# Patient Record
Sex: Female | Born: 1995 | Race: Asian | Hispanic: No | Marital: Single | Smoking: Current some day smoker
Health system: Southern US, Community
[De-identification: ages and names within clinical notes are randomized; demographics above are authoritative.]

## PROBLEM LIST (undated history)

## (undated) DIAGNOSIS — J45909 Unspecified asthma, uncomplicated: Secondary | ICD-10-CM

---

## 2019-10-22 ENCOUNTER — Emergency Department (HOSPITAL_COMMUNITY): Payer: Managed Care, Other (non HMO)

## 2019-10-22 ENCOUNTER — Emergency Department (HOSPITAL_COMMUNITY)
Admission: EM | Admit: 2019-10-22 | Discharge: 2019-10-22 | Disposition: A | Discharge status: Home / Self Care | Payer: Managed Care, Other (non HMO) | Attending: Emergency Medicine | Admitting: Emergency Medicine

## 2019-10-22 ENCOUNTER — Encounter (HOSPITAL_COMMUNITY): Payer: Self-pay | Admitting: Emergency Medicine

## 2019-10-22 DIAGNOSIS — R0789 Other chest pain: Secondary | ICD-10-CM | POA: Insufficient documentation

## 2019-10-22 DIAGNOSIS — Z8709 Personal history of other diseases of the respiratory system: Secondary | ICD-10-CM | POA: Diagnosis not present

## 2019-10-22 DIAGNOSIS — Z720 Tobacco use: Secondary | ICD-10-CM | POA: Insufficient documentation

## 2019-10-22 HISTORY — DX: Unspecified asthma, uncomplicated: J45.909

## 2019-10-22 MED ORDER — KETOROLAC TROMETHAMINE 30 MG/ML IJ SOLN
60.0000 mg | Freq: Once | INTRAMUSCULAR | Status: AC
  Administered 2019-10-22: 60 mg via INTRAMUSCULAR
  Filled 2019-10-22: qty 2

## 2019-10-22 NOTE — ED Provider Notes (Signed)
TIME SEEN: 5:31 AM  CHIEF COMPLAINT: Chest pain  HPI: Patient is a 24 year old female with history of asthma and tobacco use who presents to the emergency department with "severe" central chest pain.  Worse with movement and lying flat.  No medications taken prior to arrival.  No shortness of breath, nausea, vomiting, diaphoresis, dizziness, fevers, cough, lower extremity swelling or pain.  No Covid exposures or known previous Covid infection.  No history of PE, DVT, exogenous estrogen use, recent fractures, surgery, trauma, hospitalization or prolonged travel. No lower extremity swelling or pain. No calf tenderness.  Does have a copper IUD.   ROS: See HPI Constitutional: no fever  Eyes: no drainage  ENT: no runny nose   Cardiovascular:   chest pain  Resp: no SOB  GI: no vomiting GU: no dysuria Integumentary: no rash  Allergy: no hives  Musculoskeletal: no leg swelling  Neurological: no slurred speech ROS otherwise negative  PAST MEDICAL HISTORY/PAST SURGICAL HISTORY:  Past Medical History:  Diagnosis Date  . Asthma     MEDICATIONS:  Prior to Admission medications   Not on File    ALLERGIES:  No Known Allergies  SOCIAL HISTORY:  Social History   Tobacco Use  . Smoking status: Current Some Day Smoker  . Smokeless tobacco: Never Used  Substance Use Topics  . Alcohol use: Never    FAMILY HISTORY: No family history on file.  EXAM: BP 121/65   Pulse 86   Temp 98.7 F (37.1 C) (Oral)   Resp 16   SpO2 100%  CONSTITUTIONAL: Alert and oriented and responds appropriately to questions. Well-appearing; well-nourished HEAD: Normocephalic EYES: Conjunctivae clear, pupils appear equal, EOM appear intact ENT: normal nose; moist mucous membranes NECK: Supple, normal ROM CARD: RRR; S1 and S2 appreciated; no murmurs, no clicks, no rubs, no gallops CHEST:  Chest wall is tender to palpation over the sternum which reproduces her pain.  No crepitus, ecchymosis, erythema,  warmth, rash or other lesions present.   RESP: Normal chest excursion without splinting or tachypnea; breath sounds clear and equal bilaterally; no wheezes, no rhonchi, no rales, no hypoxia or respiratory distress, speaking full sentences ABD/GI: Normal bowel sounds; non-distended; soft, non-tender, no rebound, no guarding, no peritoneal signs, no hepatosplenomegaly BACK:  The back appears normal EXT: Normal ROM in all joints; no deformity noted, no edema; no cyanosis, no calf tenderness or calf swelling  SKIN: Normal color for age and race; warm; no rash on exposed skin NEURO: Moves all extremities equally PSYCH: The patient's mood and manner are appropriate.   MEDICAL DECISION MAKING: Patient here with atypical chest pain.  Suspect musculoskeletal in nature.  Will provide with Toradol for discomfort.  EKG shows no ischemic changes, arrhythmia or interval abnormalities.  She has no risk factors for ACS.  She is PERC negative.  Doubt dissection.  Will obtain chest x-ray to evaluate for any bony abnormality, pneumothorax.  No infectious symptoms to suggest COVID-19 or pneumonia.  Anticipate discharge home.  ED PROGRESS: Patient's pain is improved after Toradol.  Chest x-ray clear without acute abnormality.  I feel she is safe for discharge home.  Discussed that this is chest wall pain and she can alternate Tylenol and Motrin.  Patient comfortable with this plan.   At this time, I do not feel there is any life-threatening condition present. I have reviewed, interpreted and discussed all results (EKG, imaging, lab, urine as appropriate) and exam findings with patient/family. I have reviewed nursing notes and appropriate previous  records.  I feel the patient is safe to be discharged home without further emergent workup and can continue workup as an outpatient as needed. Discussed usual and customary return precautions. Patient/family verbalize understanding and are comfortable with this plan.  Outpatient  follow-up has been provided as needed. All questions have been answered.     EKG Interpretation  Date/Time:  Sunday October 22 2019 05:35:48 EST Ventricular Rate:  82 PR Interval:    QRS Duration: 73 QT Interval:  363 QTC Calculation: 424 R Axis:   75 Text Interpretation: Sinus rhythm Borderline T wave abnormalities No old tracing to compare Confirmed by Merrick Feutz, Cyril Mourning [phone]) on 10/22/2019 5:48:03 AM          Stacy Landry was evaluated in Emergency Department on 10/22/2019 for the symptoms described in the history of present illness. She was evaluated in the context of the global COVID-19 pandemic, which necessitated consideration that the patient might be at risk for infection with the SARS-CoV-2 virus that causes COVID-19. Institutional protocols and algorithms that pertain to the evaluation of patients at risk for COVID-19 are in a state of rapid change based on information released by regulatory bodies including the CDC and federal and state organizations. These policies and algorithms were followed during the patient's care in the ED.  Patient was seen wearing N95, face shield, gloves.    Nastashia Gallo, Delice Bison, DO 10/22/19 [phone]

## 2019-10-22 NOTE — Discharge Instructions (Addendum)
You may alternate Tylenol 1000 mg every 6 hours as needed for pain and Ibuprofen 800 mg every 8 hours as needed for pain.  Please take Ibuprofen with food.  Do not take more than 4000 mg of Tylenol (acetaminophen) in a 24 hour period.  These medications are found over-the-counter.  Your EKG and chest x-ray today were normal.  If this pain continues, I recommend follow-up with a primary care physician for further management.   Steps to find a Primary Care Provider (PCP):  Call [phone] or [phone] to access "[facility] Find a Doctor Service."  2.  You may also go on the [facility] website at [email or web address]  3.  [facility] also frequently accepts new patients.  [facility]  [address] [place] [address] [phone]  4.  There are also multiple [facility], [facility] and [facility] practices throughout the [place] that are frequently accepting new patients. You may find a clinic that is close to your home and contact them.  [facility] [email or web address] [phone]  [facility] [email or web address]  [facility] [email or web address] [phone]  [facility] [email or web address] [phone]  5.  Local Health Departments also can provide primary care services.  [facility]  [address] [place] [address] [phone]  [facility] Department [address] [place] [address] [phone]  [facility] [address] [place] [address]  [place] [address] [phone]

## 2019-10-22 NOTE — ED Triage Notes (Signed)
Pt here from home with c/o chest pain worse with movement  No nausea , some slight sob ,pt does have asthma and smokes

## 2020-07-29 ENCOUNTER — Encounter (HOSPITAL_COMMUNITY): Payer: Self-pay

## 2020-07-29 ENCOUNTER — Ambulatory Visit (HOSPITAL_COMMUNITY)
Admission: EM | Admit: 2020-07-29 | Discharge: 2020-07-29 | Disposition: A | Discharge status: Home / Self Care | Payer: No Typology Code available for payment source | Attending: Emergency Medicine | Admitting: Emergency Medicine

## 2020-07-29 ENCOUNTER — Emergency Department (HOSPITAL_COMMUNITY)
Admission: EM | Admit: 2020-07-29 | Discharge: 2020-07-30 | Disposition: A | Discharge status: Home / Self Care | Payer: Managed Care, Other (non HMO) | Attending: Emergency Medicine | Admitting: Emergency Medicine

## 2020-07-29 DIAGNOSIS — Z0441 Encounter for examination and observation following alleged adult rape: Secondary | ICD-10-CM | POA: Diagnosis present

## 2020-07-29 DIAGNOSIS — T7421XA Adult sexual abuse, confirmed, initial encounter: Secondary | ICD-10-CM | POA: Diagnosis present

## 2020-07-29 DIAGNOSIS — R102 Pelvic and perineal pain: Secondary | ICD-10-CM | POA: Diagnosis not present

## 2020-07-29 DIAGNOSIS — J45909 Unspecified asthma, uncomplicated: Secondary | ICD-10-CM | POA: Diagnosis not present

## 2020-07-29 DIAGNOSIS — F17210 Nicotine dependence, cigarettes, uncomplicated: Secondary | ICD-10-CM | POA: Diagnosis not present

## 2020-07-29 MED ORDER — AZITHROMYCIN 250 MG PO TABS
1000.0000 mg | ORAL_TABLET | Freq: Once | ORAL | Status: AC
  Administered 2020-07-29: 1000 mg via ORAL
  Filled 2020-07-29: qty 4

## 2020-07-29 MED ORDER — METRONIDAZOLE 500 MG PO TABS
2000.0000 mg | ORAL_TABLET | Freq: Once | ORAL | Status: AC
  Administered 2020-07-29: 2000 mg via ORAL
  Filled 2020-07-29: qty 4
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
## 2020-07-30 LAB — PREGNANCY, URINE: Preg Test, Ur: NEGATIVE
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
## 2021-10-29 IMAGING — DX DG CHEST 1V PORT
1 series · 1 of 1 positions shown · non-contrast
Comparison: None.

CLINICAL DATA: Acute chest pain.

EXAM:
PORTABLE CHEST 1 VIEW

[chest]
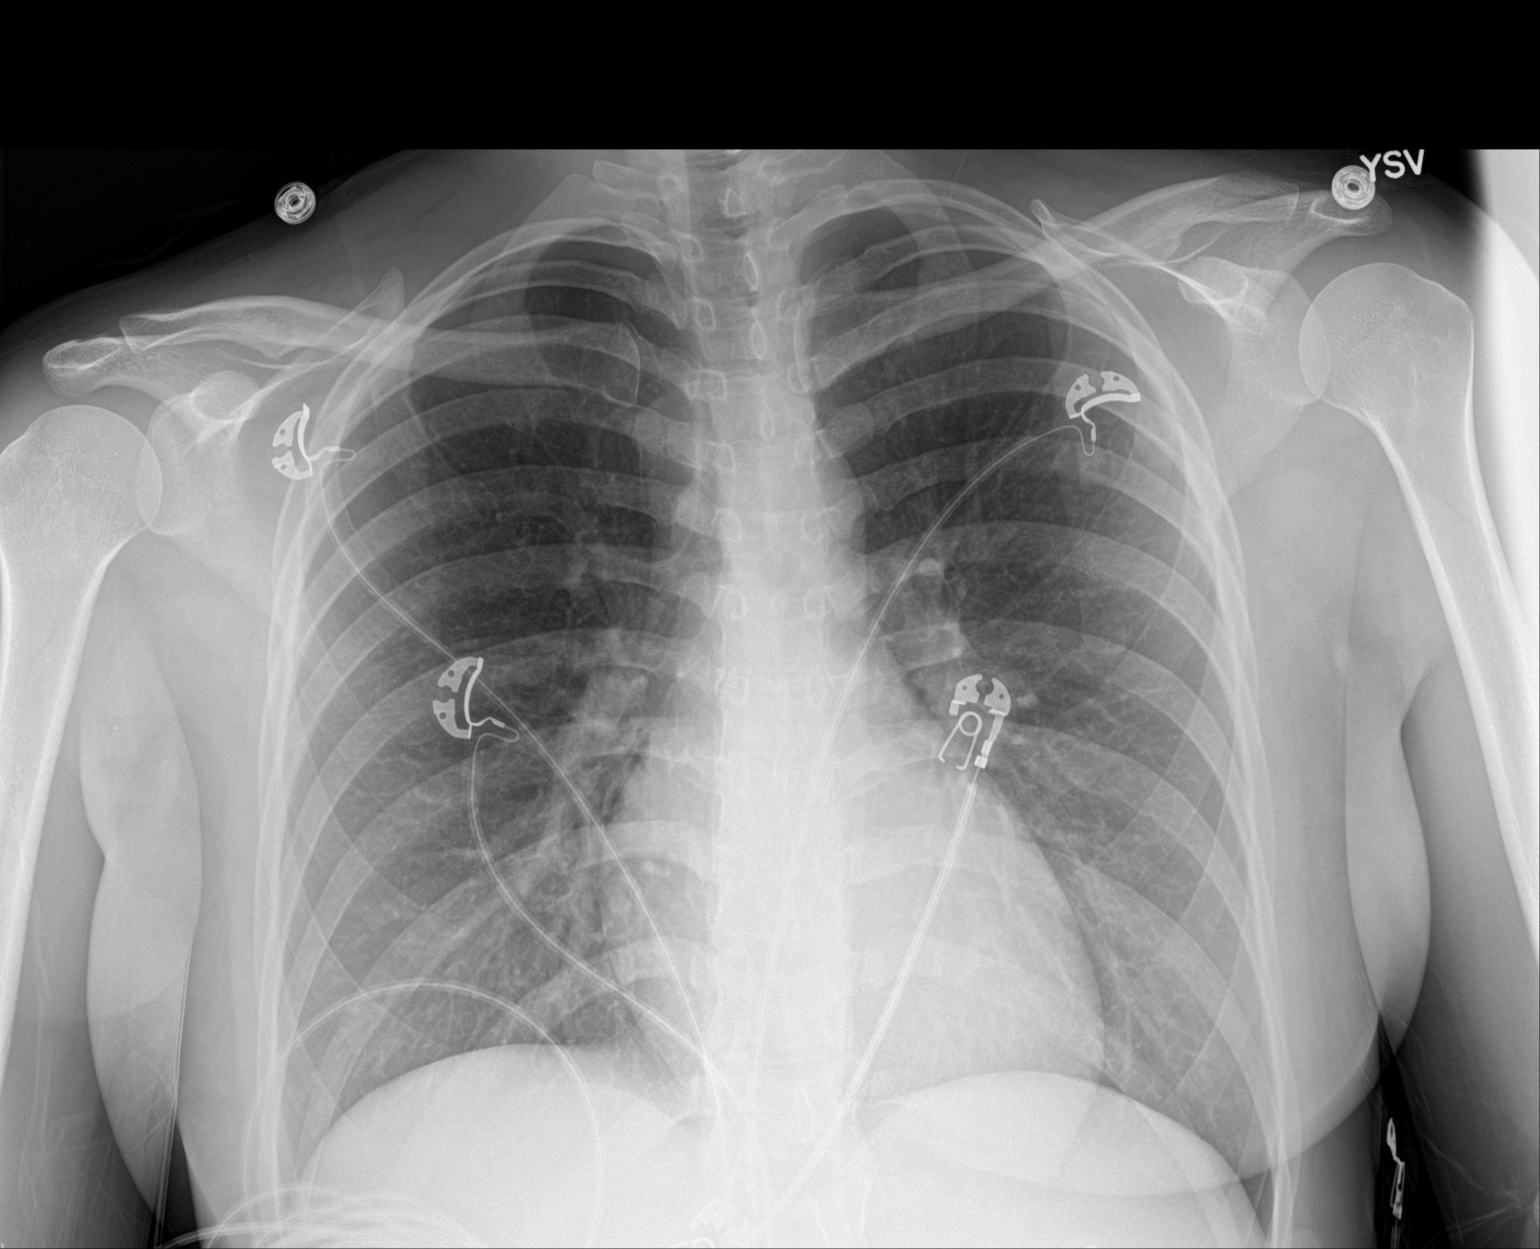

[1 of 1 positions shown; findings below may reference images not displayed]

FINDINGS: The cardiomediastinal silhouette is unremarkable.

There is no evidence of focal airspace disease, pulmonary edema,
suspicious pulmonary nodule/mass, pleural effusion, or pneumothorax.

No acute bony abnormalities are identified.
IMPRESSION: No active disease.
